# Patient Record
Sex: Female | Born: 1985 | Race: Black or African American | Hispanic: No | Marital: Single | Smoking: Never smoker
Health system: Southern US, Community
[De-identification: ages and names within clinical notes are randomized; demographics above are authoritative.]

## PROBLEM LIST (undated history)

## (undated) DIAGNOSIS — I10 Essential (primary) hypertension: Secondary | ICD-10-CM

## (undated) DIAGNOSIS — F319 Bipolar disorder, unspecified: Secondary | ICD-10-CM

---
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
## 2005-04-08 IMAGING — US US OB COMP LESS 14 WK
1 series · 13 of 28 positions shown · non-contrast
Comparison: none

CLINICAL DATA: Assess estimated gestational age.

[Series 1: unknown · 0.22mm/px · 13 of 50 slices shown]
[im 2/50]
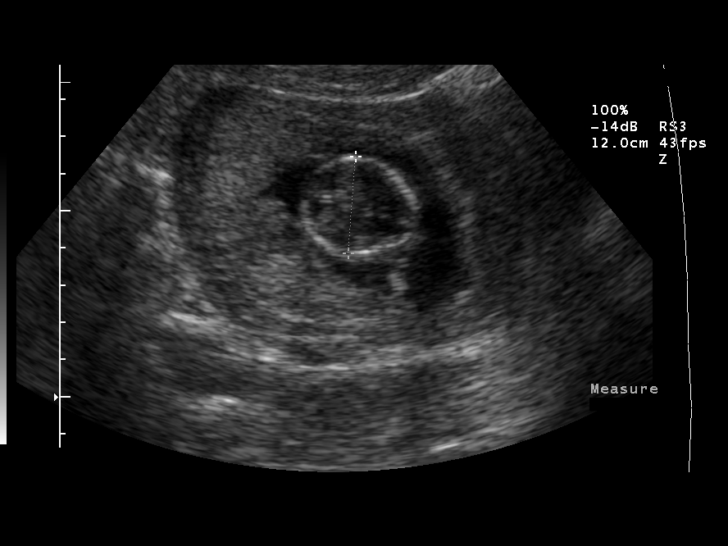
[im 6/50]
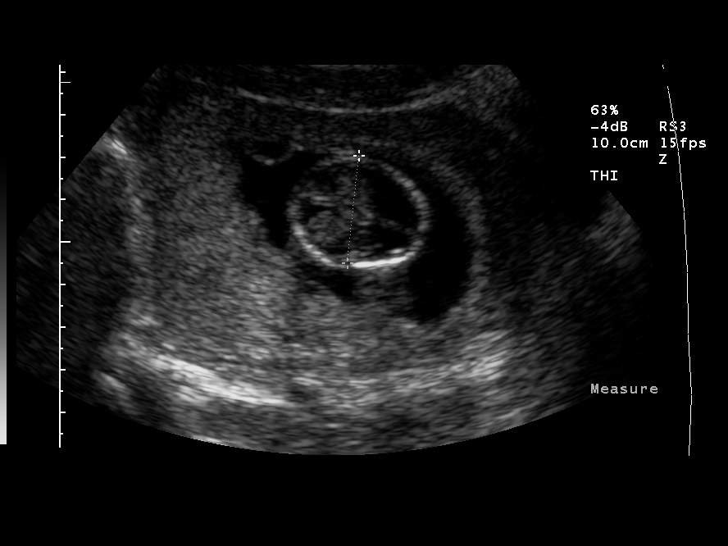
[im 10/50]
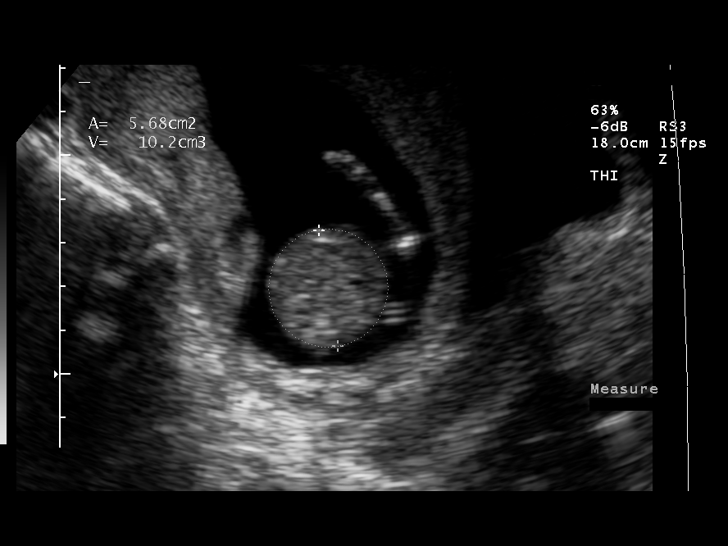
[im 13/50]
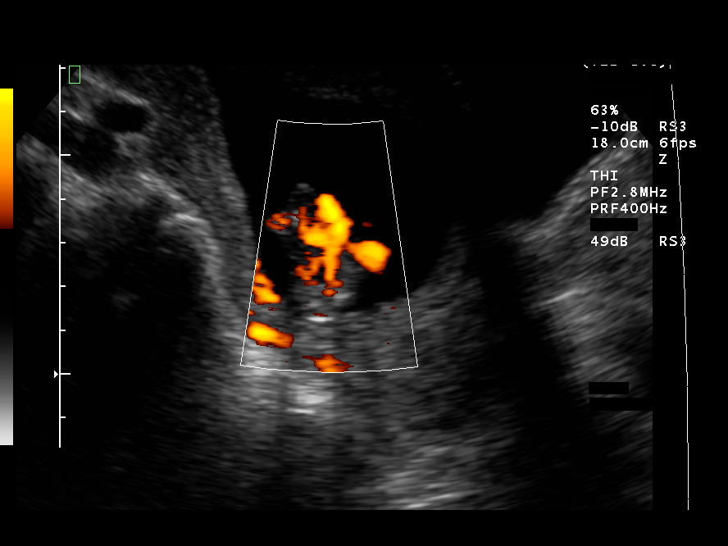
[im 17/50]
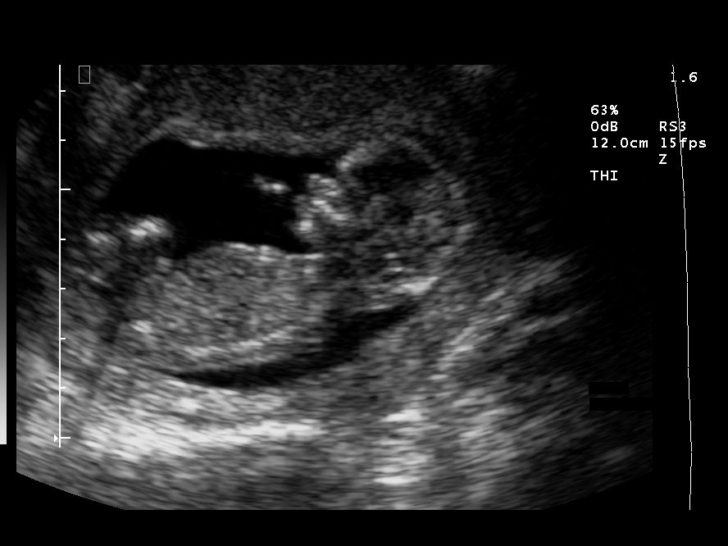
[im 20/50]
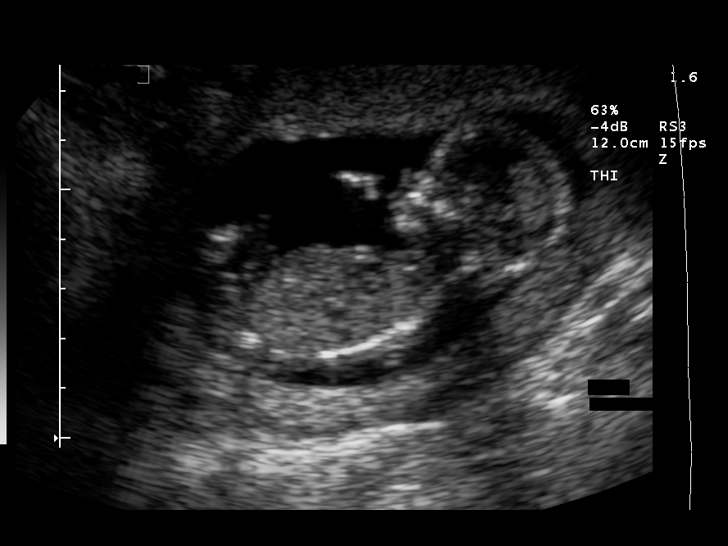
[im 26/50]
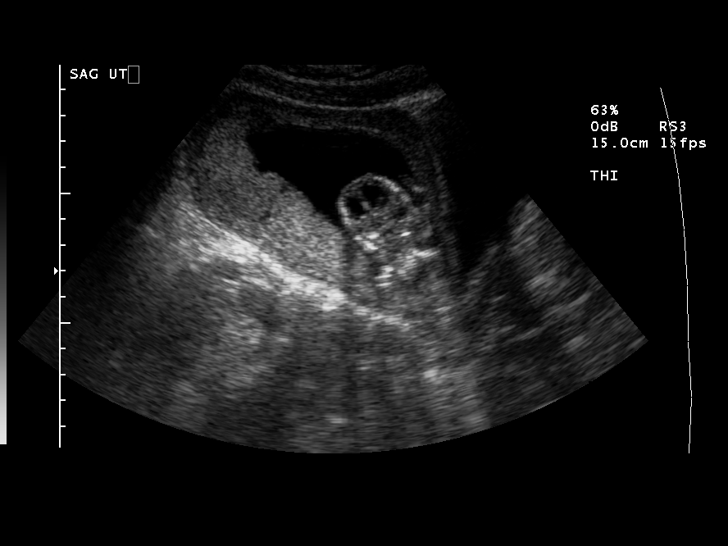
[im 30/50]
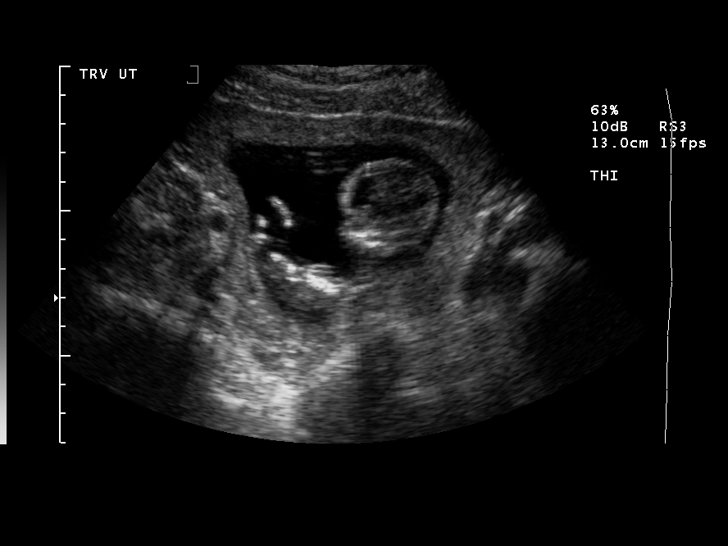
[im 33/50]
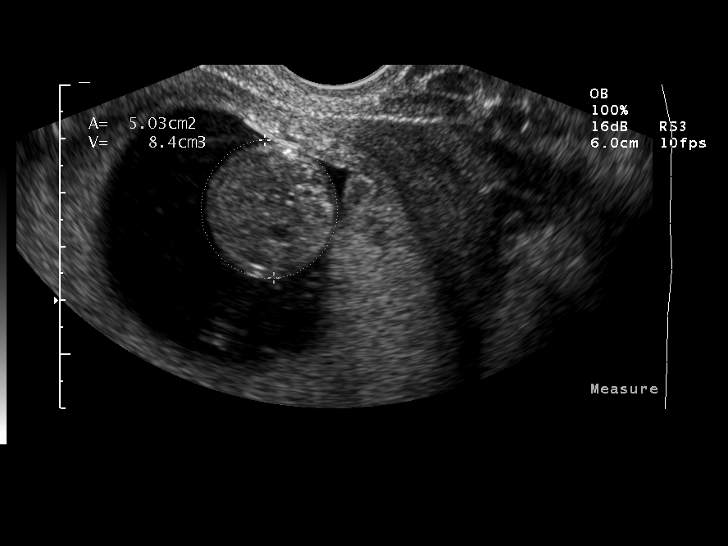
[im 37/50]
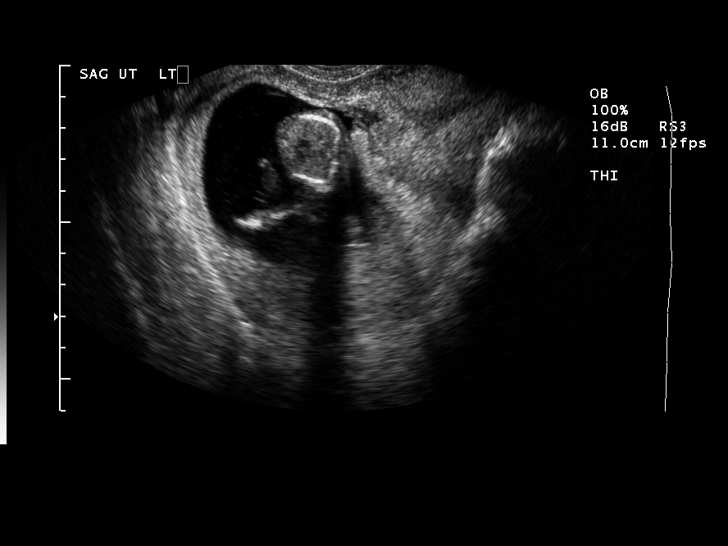
[im 40/50]
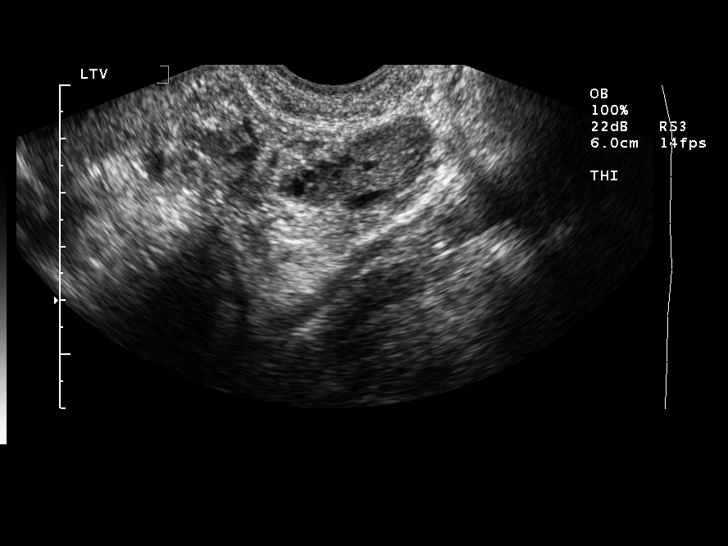
[im 44/50]
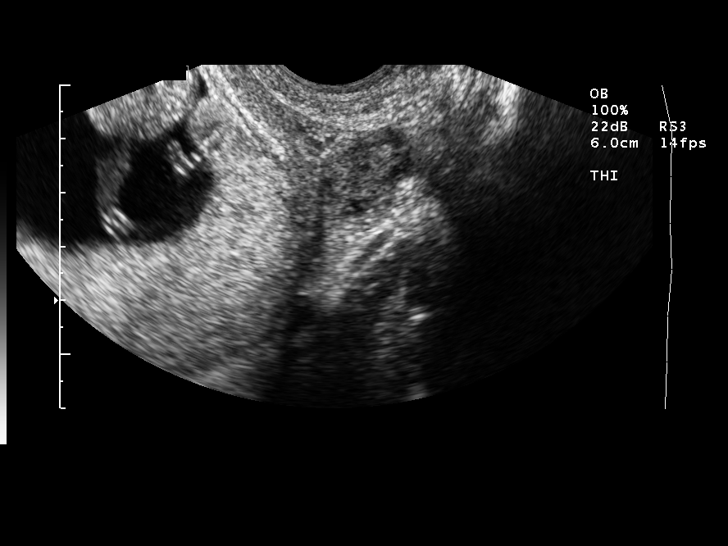
[im 48/50]
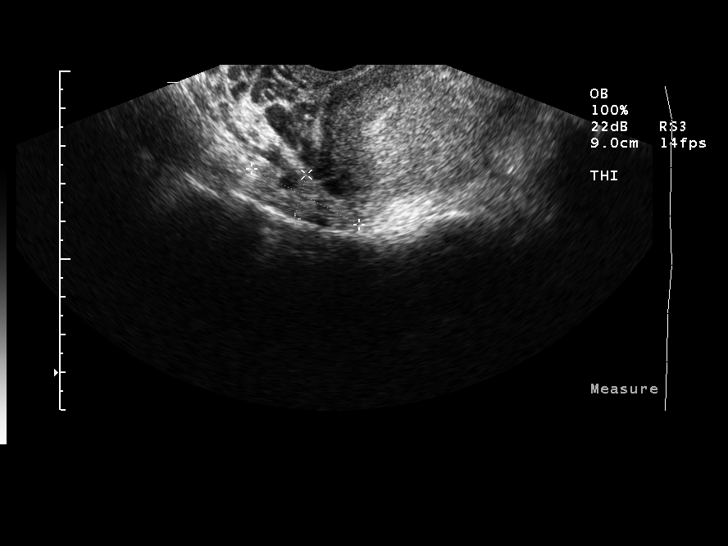

[13 of 28 positions shown; findings below may reference images not displayed]

EARLY OBSTETRICAL ULTRASOUND WITH TRANSVAGINAL

NUMBER OF FETUSES:  1
HEART RATE:  150 

FETAL BIOMETRY
BPD:  2.6 cm  14 w 3 d
HC:  9.7 cm   14 w 3 d
AC:  8.4 cm  14 w 5 d
FL:  1.3 cm  13 w 6 d
MEAN GA:    14 w 3 d
GA BY LMP:  13 w 4 d

FETAL ANATOMY
LATERAL VENTRICLES:    CP visualized  
THALAMI/CSP:      Not visualized 
POSTERIOR FOSSA:    Not visualized 
NUCHAL REGION:    Not visualized 
SPINE:      Not visualized 
4 CHAMBER HEART ON LEFT:      Not visualized 
STOMACH ON LEFT:      Visualized 
3 VESSEL CORD:    Not visualized 
CORD INSERTION SITE:    Not visualized 
KIDNEYS:    Not visualized 
BLADDER:    Visualized 
EXTREMITIES:      Not visualized 

MATERNAL FINDINGS
CERVIX:   Not evaluated.  Left ovary is 3.0 x 1.4 x 1.4 cm and right ovary is 3.2 x 1.2 x 2.0 cm.
IMPRESSION: Single intrauterine pregnancy demonstrating an estimated gestational age by ultrasound of 14 weeks and 3 days.  This is 6 days ahead of expected estimated gestational age by LMP of 13 weeks and 4 days.  
A limited anatomic assessment was possible due to early gestational age with the following normal anatomy visible:  Profile, early extremities, choroid plexus, stomach, and bladder.  A nasal bone is visualized.  Follow-up assessment of fetal anatomy would be recommended in four weeks.
Both ovaries are seen and have a normal appearance.

## 2005-09-02 IMAGING — US US OB FOLLOW-UP
1 series · 18 of 28 positions shown · non-contrast
Comparison: none

CLINICAL DATA: Assess growth.

[Series 1: us ob re-eval · 18 of 30 slices shown]
[im 1/30]
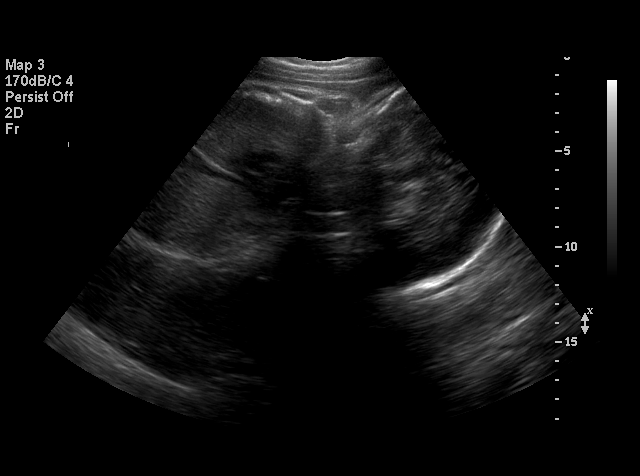
[im 3/30]
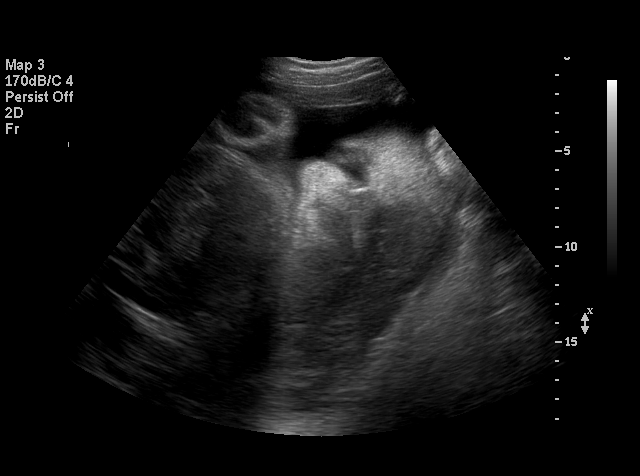
[im 4/30]
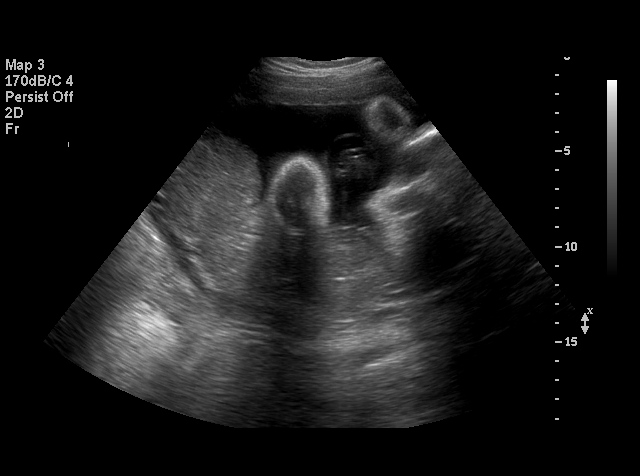
[im 6/30]
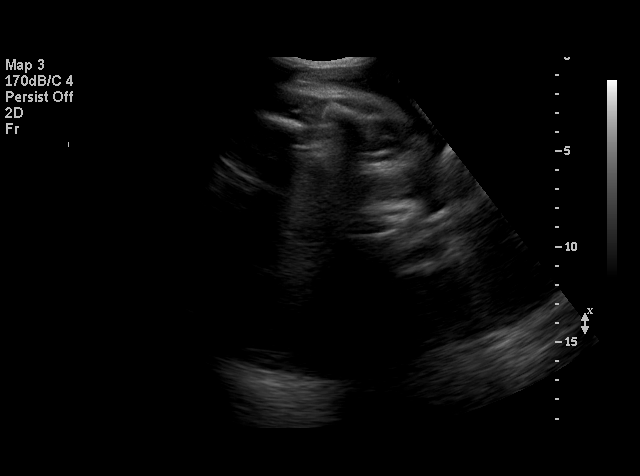
[im 8/30]
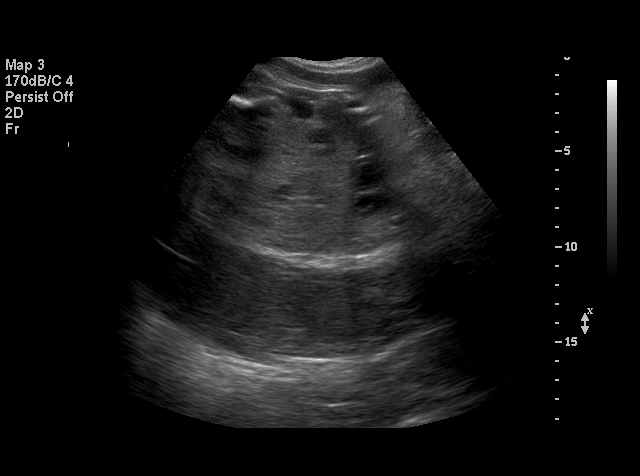
[im 9/30]
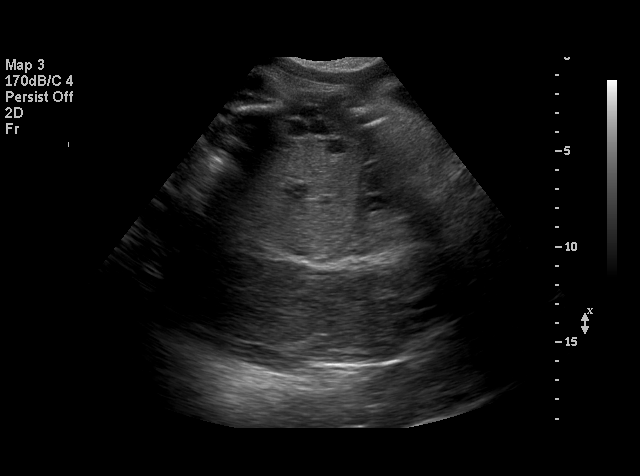
[im 11/30]
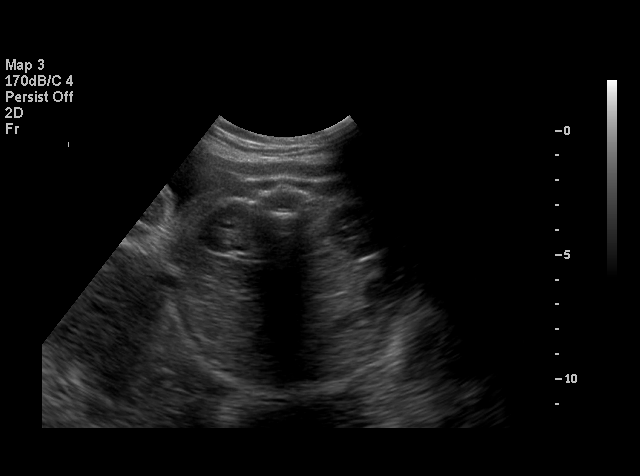
[im 12/30]
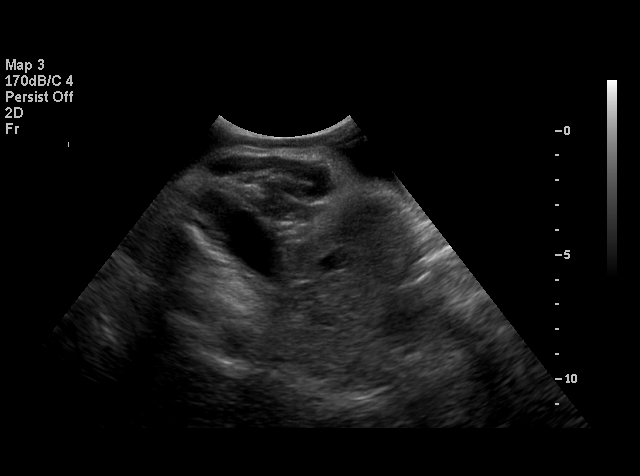
[im 14/30]
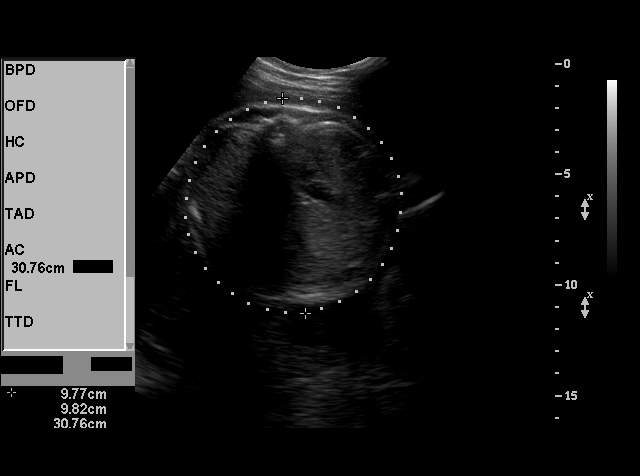
[im 16/30]
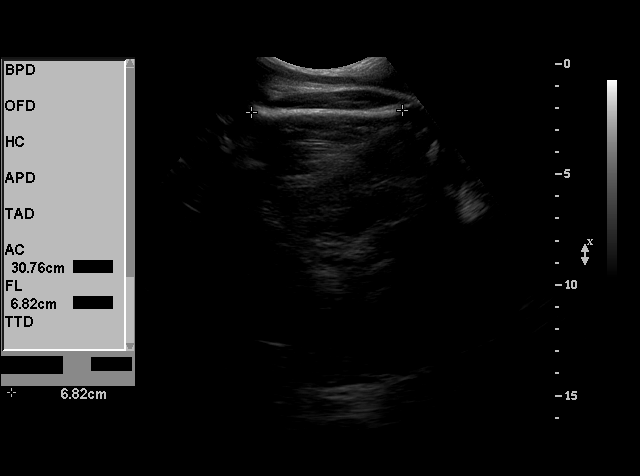
[im 18/30]
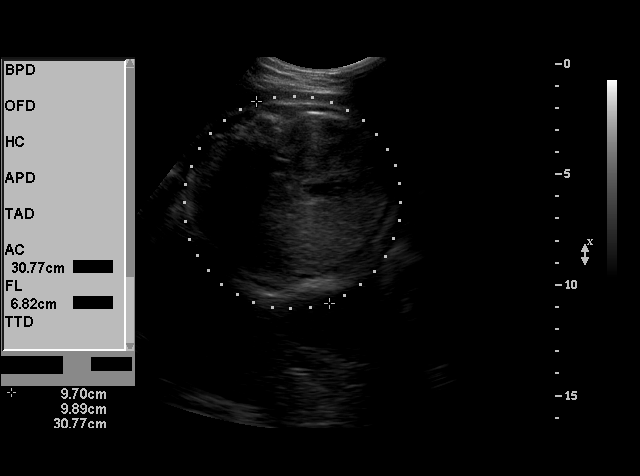
[im 19/30]
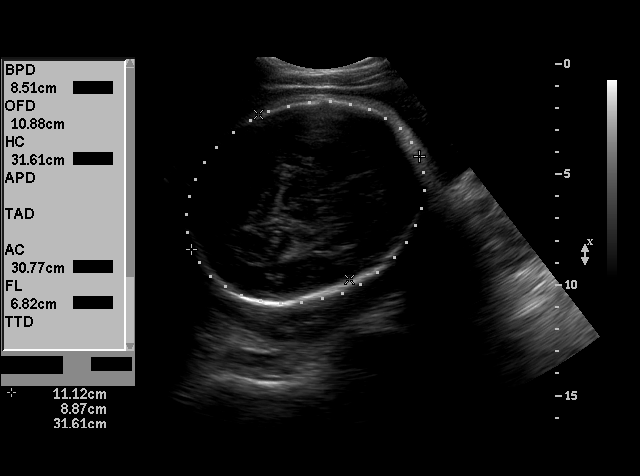
[im 21/30]
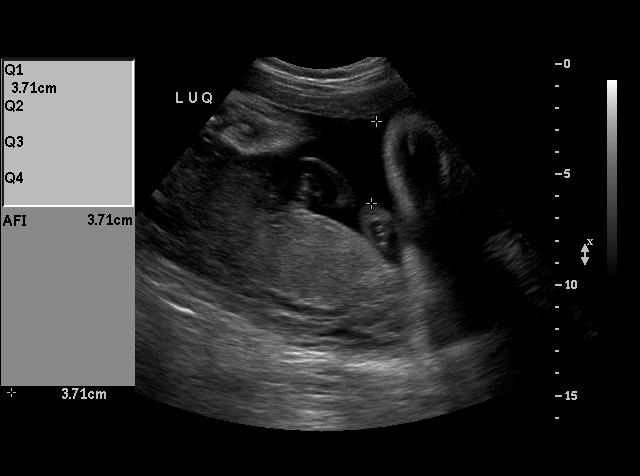
[im 23/30]
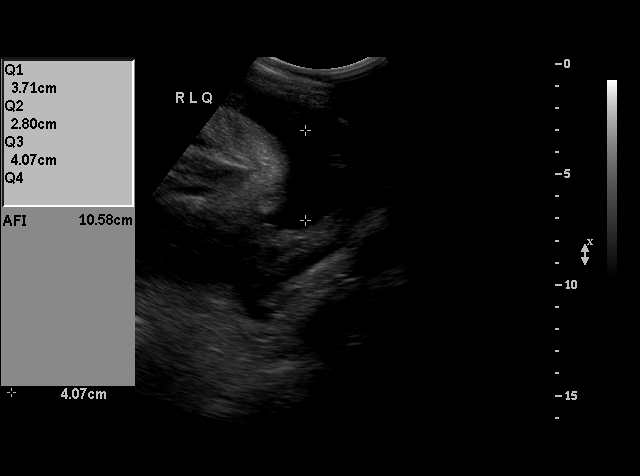
[im 24/30]
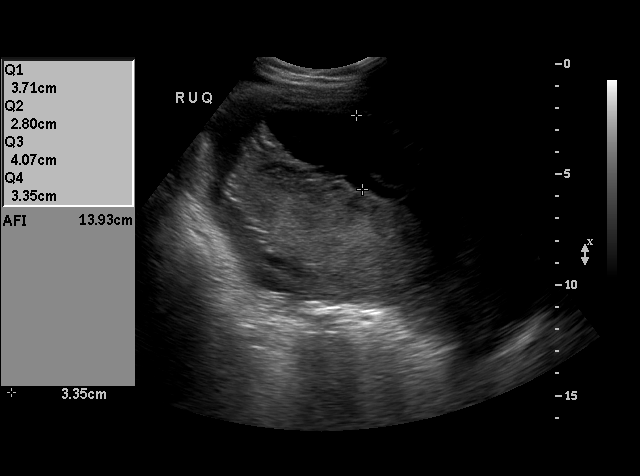
[im 26/30]
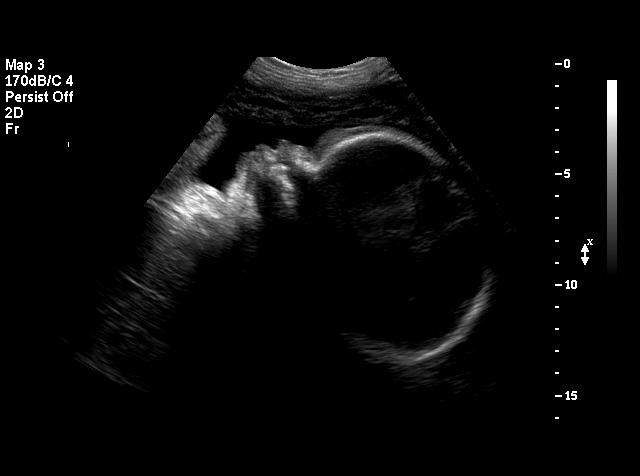
[im 27/30]
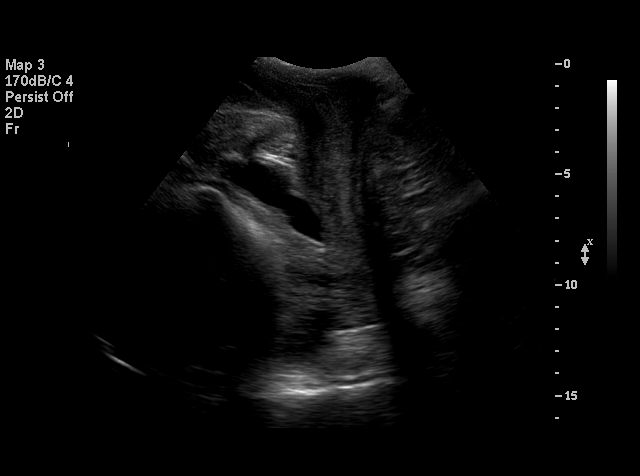
[im 30/30]
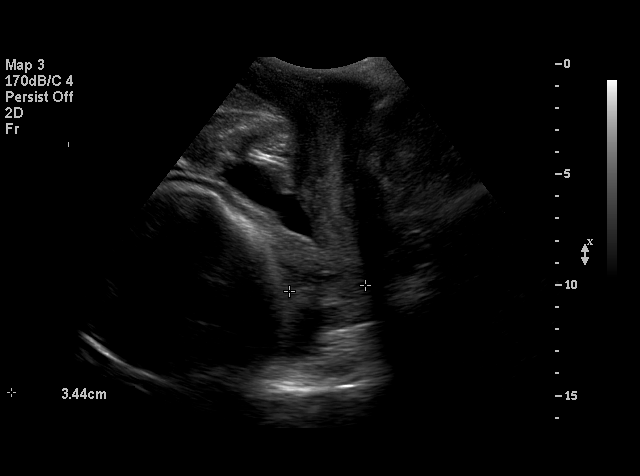

[18 of 28 positions shown; findings below may reference images not displayed]

OBSTETRICAL ULTRASOUND RE-EVALUATION
Number of Fetuses:  1
Heart Rate:  130
Movement:  Yes
Breathing:  Yes
Presentation:  Cephalic
Placental Location:  Posterior
Grade:  I
Previa:  No
Amniotic Fluid (subjective):  Normal
Amniotic Fluid (objective):  13.9 cm AFI (5th -95th%ile =   7.9 ? 24.9 cm for 35 wks)

FETAL BIOMETRY
BPD:  8.6 cm   34 w 4 d
HC:  31.9 cm  36 w 0 d
AC:  30.8 cm   34 w 6 d
FL:  6.8 cm   34 w 5 d

Mean GA:  35 w 1 d
Assigned GA:  35 w 3 d (1st US)

EFW:  0737 g (H) 50th ? 75th%ile (8444 ? 5548 g) For 35 wks

FETAL ANATOMY
Lateral Ventricles:  Visualized 
Thalami/CSP:  Previously seen 
Posterior Fossa:  Previously seen 
Nuchal Region:  Previously seen 
Spine:  Previously seen 
4 Chamber Heart on Left:  Previously seen 
Stomach on Left:  Visualized 
3 Vessel Cord:  Previously seen 
Cord Insertion Site:  Previously seen 
Kidneys:  Visualized 
Bladder:  Visualized 
Extremities:  Previously seen 

ADDITIONAL ANATOMY VISUALIZED:  Diaphragm and male genitalia

MATERNAL FINDINGS
Cervix: 3.6 cm Translabially
IMPRESSION: Single intrauterine pregnancy demonstrating an estimated gestational age by ultrasound of 35 weeks 1 day.  Correlation with assigned gestational age by initial ultrasound of 35 weeks and 3 days suggests appropriate growth.  
No late developing fetal anatomic abnormalities are identified associated with the lateral ventricles, stomach, kidneys and bladder possible.  A four chamber heart view could not be reassessed due to positioning. 
Subjectively and quantitatively normal amniotic fluid and normal cervical length.

## 2006-01-14 ENCOUNTER — Ambulatory Visit: Payer: Self-pay | Admitting: Obstetrics and Gynecology

## 2006-01-20 ENCOUNTER — Ambulatory Visit: Payer: Self-pay | Admitting: Obstetrics & Gynecology

## 2006-01-20 ENCOUNTER — Other Ambulatory Visit: Admission: RE | Admit: 2006-01-20 | Discharge: 2006-01-20 | Payer: Self-pay | Admitting: Obstetrics & Gynecology

## 2006-02-03 ENCOUNTER — Ambulatory Visit: Payer: Self-pay | Admitting: Obstetrics and Gynecology

## 2006-05-26 ENCOUNTER — Encounter: Payer: Self-pay | Admitting: Obstetrics and Gynecology

## 2006-05-26 ENCOUNTER — Ambulatory Visit: Payer: Self-pay | Admitting: Obstetrics and Gynecology

## 2008-06-29 ENCOUNTER — Ambulatory Visit: Payer: Self-pay | Admitting: Family Medicine

## 2008-06-29 LAB — CONVERTED CEMR LAB
Chlamydia, DNA Probe: NEGATIVE
GC Probe Amp, Genital: NEGATIVE

## 2009-11-01 ENCOUNTER — Emergency Department (HOSPITAL_COMMUNITY): Admission: EM | Admit: 2009-11-01 | Discharge: 2009-11-01 | Payer: Self-pay | Admitting: Emergency Medicine

## 2010-04-24 LAB — POCT PREGNANCY, URINE: Preg Test, Ur: NEGATIVE

## 2010-05-20 LAB — POCT PREGNANCY, URINE: Preg Test, Ur: NEGATIVE

## 2010-06-24 NOTE — Group Therapy Note (Signed)
Shannon Horne, Shannon Horne              ACCOUNT NO.:  [id number]   MEDICAL RECORD NO.:  [id number]          PATIENT TYPE:  WOC   LOCATION:  [facility]                   FACILITY:  [facility]   PHYSICIAN:  Tinnie Gens, MD        DATE OF BIRTH:  1985-11-27   DATE OF SERVICE:  06/29/2008                                  CLINIC NOTE   CHIEF COMPLAINT:  Abnormal colpo.   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  She had negative Pap after that, came back with  abnormal Pap, [facility] and underwent  colposcopy with biopsy that showed CIN 1-2 with a negative ECC with a  history of a previous LEEP.  It was felt she would best be served by  cryosurgery today and the patient agrees to this.   PROCEDURE:  Her cervix was visualized and it is possibly somewhat  shortened and flat to the vaginal mucosa, but cryo was done easily with  freeze x3, thaw for 5 minutes and the second freeze x3 minutes.  The  patient tolerated the procedure well.  The patient did have a thick  yellow discharge noted at the time of the procedure and GC and Chlamydia  cultures were obtained.   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   PLAN:  Followup Pap in 4 months.  We will also check cultures.           ______________________________  Tinnie Gens, MD     TP/MEDQ  D:  06/29/2008  T:  06/30/2008  Job:  [id number]

## 2010-06-27 NOTE — Op Note (Signed)
Shannon Horne, Shannon Horne                        ACCOUNT NO.:  [id number]   MEDICAL RECORD NO.:  [id number]                   PATIENT TYPE:  INP   LOCATION:  9126                                 [facility]ACILITY[facility]   PHYSICIAN:  Conni Elliot, M.D.             DATE OF BIRTH:  [birth year]   DATE OF PROCEDURE:  07/17/2003  DATE OF DISCHARGE:                                 OPERATIVE REPORT   PREOPERATIVE DIAGNOSES:  Arrest of descent.   POSTOPERATIVE DIAGNOSES:  Arrest of descent.   OPERATION:  Low transverse cesarean delivery.   SURGEON:  Conni Elliot, M.D.   ANESTHESIA:  Continuous lumbar epidural.   FINDINGS:  Fetus with Apgar of 9 & 9, cord pH was 7, placenta sent to  pathology.   DESCRIPTION OF PROCEDURE:  After receiving continuous lumbar epidural  anesthesia, the patient was placed supine __________ receiving oxygen and  was prepped in a sterile fashion.  A low transverse incision was made,  incision was made in the skin, subcutaneous fascia, rectus muscles and  peritoneum entered bluntly.  Bladder flap made and low transverse uterine  incision was made.  The infant was delivered from a vertex presentation,  cord double clamped and cut and baby handed to the pediatrician in  attendance.  The placenta was delivered spontaneously.  The uterus, bladder  flap, anterior peritoneum, fascia, subcutaneous and skin closed in  __________.  Estimated blood loss less than 800 mL.                                               Conni Elliot, M.D.    ASG/MEDQ  D:  07/17/2003  T:  07/18/2003  Job:  [id number]

## 2010-06-27 NOTE — Group Therapy Note (Signed)
Shannon Horne, Shannon Horne              ACCOUNT NO.:  [id number]   MEDICAL RECORD NO.:  [id number]          PATIENT TYPE:  WOC   LOCATION:  [facility]                   FACILITY:  [facility]   PHYSICIAN:  Argentina Donovan, MD        DATE OF BIRTH:  12-07-1985   DATE OF SERVICE:  05/26/2006                                  CLINIC NOTE   The patient is a 25 year old gravida 1, para 1-0-0-1 who had a LEEP  procedure in December of 2006 that showed CIN III with extension into  the endocervical glands and involved the axis cervical endocervical  margin; the axis cervical margin was negative for dysplasia.  Pap smear  was repeated today.  The cervix looked well-epithelialized and normal.   The patient was told that she will be notified either by letter or phone  call of the results  and if she has not heard within 3 weeks, she is to  call and check on [place] to make absolutely sure that __________ Pap smear.           ______________________________  Argentina Donovan, MD     PR/MEDQ  D:  05/26/2006  T:  05/26/2006  Job:  [id number]

## 2010-06-27 NOTE — Discharge Summary (Signed)
NAME:  Shannon Horne, Shannon Horne                        ACCOUNT NO.:  [id number]   MEDICAL RECORD NO.:  [id number]                   PATIENT TYPE:  INP   LOCATION:  9126                                 [facility]ILITY[facility]   PHYSICIAN:  Conni Elliot, M.D.             DATE OF BIRTH:  January 13, 1986   DATE OF ADMISSION:  07/16/2003  DATE OF DISCHARGE:  07/21/2003                                 DISCHARGE SUMMARY   HISTORY OF PRESENT ILLNESS:  This 25 year old gravida 1 para 0 at [redacted] weeks  gestation was admitted for induction of labor due to post dates.  Cervical  ripening agents were utilized to ripen the cervix.  This was followed by  Pitocin augmentation.  However, the patient ultimately became complete and  +1 to +2 station, LOA position.  The patient had arrest of descent and  required operative delivery on July 17, 2003; a 7-pound 14-ounce infant with  Apgars of 9 and 9; cord pH was 7.28.  The patient had normal postoperative  recovery.  However, this was complicated by a febrile course.  The patient  was placed on IV antibiotics.  The patient responded to the IV antibiotics  and was felt ready for discharge and regained normal bowel and bladder  function by June 11.  The patient was discharged on ciprofloxin and  metronidazole as well as pain relief with Percocet.  The patient was to  follow up in the [facility] in approximately 6 weeks.                                               Conni Elliot, M.D.    ASG/MEDQ  D:  09/25/2003  T:  09/26/2003  Job:  [id number]

## 2011-05-07 ENCOUNTER — Encounter (HOSPITAL_COMMUNITY): Payer: Self-pay

## 2011-05-07 ENCOUNTER — Emergency Department (HOSPITAL_COMMUNITY)
Admission: EM | Admit: 2011-05-07 | Discharge: 2011-05-08 | Disposition: A | Discharge status: Home / Self Care | Payer: Self-pay | Attending: Emergency Medicine | Admitting: Emergency Medicine

## 2011-05-07 DIAGNOSIS — S21109A Unspecified open wound of unspecified front wall of thorax without penetration into thoracic cavity, initial encounter: Secondary | ICD-10-CM | POA: Insufficient documentation

## 2011-05-07 DIAGNOSIS — S41109A Unspecified open wound of unspecified upper arm, initial encounter: Secondary | ICD-10-CM | POA: Insufficient documentation

## 2011-05-07 DIAGNOSIS — Y9239 Other specified sports and athletic area as the place of occurrence of the external cause: Secondary | ICD-10-CM | POA: Insufficient documentation

## 2011-05-07 DIAGNOSIS — I10 Essential (primary) hypertension: Secondary | ICD-10-CM | POA: Insufficient documentation

## 2011-05-07 DIAGNOSIS — Z23 Encounter for immunization: Secondary | ICD-10-CM | POA: Insufficient documentation

## 2011-05-07 DIAGNOSIS — F319 Bipolar disorder, unspecified: Secondary | ICD-10-CM | POA: Insufficient documentation

## 2011-05-07 DIAGNOSIS — T148XXA Other injury of unspecified body region, initial encounter: Secondary | ICD-10-CM

## 2011-05-07 HISTORY — DX: Essential (primary) hypertension: I10

## 2011-05-07 HISTORY — DX: Bipolar disorder, unspecified: F31.9

## 2011-05-07 MED ORDER — TETANUS-DIPHTH-ACELL PERTUSSIS 5-2.5-18.5 LF-MCG/0.5 IM SUSP
0.5000 mL | Freq: Once | INTRAMUSCULAR | Status: AC
  Administered 2011-05-07: 0.5 mL via INTRAMUSCULAR
  Filled 2011-05-07: qty 0.5

## 2011-05-07 MED ORDER — BACITRACIN ZINC 500 UNIT/GM EX OINT
TOPICAL_OINTMENT | CUTANEOUS | Status: AC
  Administered 2011-05-07: 23:00:00
  Filled 2011-05-07: qty 0.9
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
## 2011-05-08 MED ORDER — ACETAMINOPHEN 325 MG PO TABS: 650.0000 mg | ORAL_TABLET | Freq: Once | ORAL | Status: DC

## 2011-05-08 MED ORDER — ACETAMINOPHEN 325 MG PO TABS
ORAL_TABLET | ORAL | Status: AC
  Filled 2011-05-08: qty 2

## 2011-05-08 NOTE — ED Provider Notes (Signed)
Medical screening examination/treatment/procedure(s) were performed by non-physician practitioner and as supervising physician I was immediately available for consultation/collaboration.   Hanley Seamen, MD 05/08/11 [phone]
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
## 2022-05-16 ENCOUNTER — Ambulatory Visit (HOSPITAL_COMMUNITY)
Admission: EM | Admit: 2022-05-16 | Discharge: 2022-05-16 | Disposition: A | Discharge status: Home / Self Care | Payer: Self-pay

## 2022-05-16 ENCOUNTER — Encounter (HOSPITAL_COMMUNITY): Payer: Self-pay | Admitting: *Deleted

## 2022-05-16 ENCOUNTER — Ambulatory Visit (INDEPENDENT_AMBULATORY_CARE_PROVIDER_SITE_OTHER): Payer: Self-pay

## 2022-05-16 DIAGNOSIS — M545 Low back pain, unspecified: Secondary | ICD-10-CM

## 2022-05-16 DIAGNOSIS — H5509 Other forms of nystagmus: Secondary | ICD-10-CM

## 2022-05-16 DIAGNOSIS — M546 Pain in thoracic spine: Secondary | ICD-10-CM

## 2022-05-16 MED ORDER — NAPROXEN 500 MG PO TABS: 500.0000 mg | ORAL_TABLET | Freq: Two times a day (BID) | ORAL | 0 refills | Status: AC

## 2022-05-16 MED ORDER — METHOCARBAMOL 500 MG PO TABS: 500.0000 mg | ORAL_TABLET | Freq: Two times a day (BID) | ORAL | 0 refills | Status: AC

## 2022-05-16 NOTE — Discharge Instructions (Addendum)
Your x-ray showed that you have scoliosis (abnormal curvature of your spine) but was otherwise normal.  Take methocarbamol up to twice a day.  This make you sleepy so do not drive or drink alcohol while taking this medication.  Take Naprosyn twice a day.  Do not take NSAIDs with this medication including aspirin, ibuprofen/Advil, naproxen/Aleve.  You can use Tylenol/acetaminophen for additional symptom relief.  Use heat and gentle stretch for symptom relief.  If anything worsens or changes you should be seen immediately.  As we discussed, there was some abnormal eye movement when we did your exam.  This could be something that you have had since you were born.  I would like you to follow-up with a neurologist.  If you have any changing symptoms including headache, dizziness, confusion, nausea, vomiting you need to go to the emergency room.
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
## 2022-06-11 ENCOUNTER — Encounter (HOSPITAL_COMMUNITY): Payer: Self-pay
